# Patient Record
Sex: Male | Born: 1975 | Race: White | Hispanic: No | Marital: Married | Smoking: Never smoker
Health system: Southern US, Community
[De-identification: ages and names within clinical notes are randomized; demographics above are authoritative.]

## PROBLEM LIST (undated history)

## (undated) DIAGNOSIS — F419 Anxiety disorder, unspecified: Secondary | ICD-10-CM

## (undated) HISTORY — PX: OTHER SURGICAL HISTORY: SHX169

---

## 2012-08-16 ENCOUNTER — Emergency Department (HOSPITAL_BASED_OUTPATIENT_CLINIC_OR_DEPARTMENT_OTHER)
Admission: EM | Admit: 2012-08-16 | Discharge: 2012-08-16 | Disposition: A | Discharge status: Home / Self Care | Payer: Worker's Compensation | Attending: Emergency Medicine | Admitting: Emergency Medicine

## 2012-08-16 ENCOUNTER — Encounter (HOSPITAL_BASED_OUTPATIENT_CLINIC_OR_DEPARTMENT_OTHER): Payer: Self-pay | Admitting: Family Medicine

## 2012-08-16 DIAGNOSIS — S61209A Unspecified open wound of unspecified finger without damage to nail, initial encounter: Secondary | ICD-10-CM | POA: Insufficient documentation

## 2012-08-16 DIAGNOSIS — Y9389 Activity, other specified: Secondary | ICD-10-CM | POA: Insufficient documentation

## 2012-08-16 DIAGNOSIS — Y9289 Other specified places as the place of occurrence of the external cause: Secondary | ICD-10-CM | POA: Insufficient documentation

## 2012-08-16 DIAGNOSIS — W268XXA Contact with other sharp object(s), not elsewhere classified, initial encounter: Secondary | ICD-10-CM | POA: Insufficient documentation

## 2012-08-16 DIAGNOSIS — F411 Generalized anxiety disorder: Secondary | ICD-10-CM | POA: Insufficient documentation

## 2012-08-16 DIAGNOSIS — Y990 Civilian activity done for income or pay: Secondary | ICD-10-CM | POA: Insufficient documentation

## 2012-08-16 HISTORY — DX: Anxiety disorder, unspecified: F41.9

## 2012-08-16 NOTE — ED Provider Notes (Signed)
History     CSN: [id number]  Arrival date & time 08/16/12  0802   First MD Initiated Contact with Patient 08/16/12 0840      Chief Complaint  Patient presents with  . Extremity Laceration    (Consider location/radiation/quality/duration/timing/severity/associated sxs/prior treatment) The history is provided by the patient.   37 year old male suffered a laceration to his left fifth finger while he was using shears to try and strip some wire. Last tetanus immunization was within the past 3 years. He denies other injury.  Past Medical History  Diagnosis Date  . Anxiety     History reviewed. No pertinent past surgical history.  No family history on file.  History  Substance Use Topics  . Smoking status: Never Smoker   . Smokeless tobacco: Not on file  . Alcohol Use: Yes      Review of Systems  All other systems reviewed and are negative.    Allergies  Review of patient's allergies indicates no known allergies.  Home Medications   Current Outpatient Rx  Name  Route  Sig  Dispense  Refill  . ALPRAZOLAM PO   Oral   Take by mouth as needed.           BP 115/83  Pulse 71  Temp(Src) 97.6 F (36.4 C) (Oral)  Resp 16  Ht 5\' 5"  (1.651 m)  Wt 180 lb (81.647 kg)  BMI 29.95 kg/m2  SpO2 100%  Physical Exam  Nursing note and vitals reviewed.  37 year old male, resting comfortably and in no acute distress. Vital signs are normal. Oxygen saturation is 100%, which is normal. Head is normocephalic and atraumatic. PERRLA, EOMI. Oropharynx is clear. Neck is nontender and supple without adenopathy or JVD. Back is nontender and there is no CVA tenderness. Lungs are clear without rales, wheezes, or rhonchi. Chest is nontender. Heart has regular rate and rhythm without murmur. Abdomen is soft, flat, nontender without masses or hepatosplenomegaly and peristalsis is normoactive. Extremities: There is a 2 cm laceration of the ulnar aspect of the distal phalanx of the  left fifth finger. Laceration is linear and clean and does not have any contamination.. Skin is warm and dry without rash. Neurologic: Mental status is normal, cranial nerves are intact, there are no motor or sensory deficits.  ED Course  Procedures (including critical care time)  LACERATION REPAIR Performed by: WUJWJ,XBJYN Authorized by: WGNFA,OZHYQ Consent: Verbal consent obtained. Risks and benefits: risks, benefits and alternatives were discussed Consent given by: patient Patient identity confirmed: provided demographic data Prepped and Draped in normal sterile fashion Wound explored  Laceration Location: Left fifth finger distal phalanx  Laceration Length: 2.0 cm  No Foreign Bodies seen or palpated  Anesthesia: local infiltration  Local anesthetic: lidocaine 2% without epinephrine  Anesthetic total: 2 ml  Amount of cleaning: standard  Skin closure: Close   Number of sutures: 4  Technique: Simple over and under suture with 5-0 nylon   Patient tolerance: Patient tolerated the procedure well with no immediate complications.    1. Laceration of fifth finger, left       MDM  Laceration of the left fifth finger. Sutures are placed and is sent home with routine wound care instructions.        Dione Booze, MD 08/16/12 0900

## 2012-08-16 NOTE — ED Notes (Signed)
Pt has laceration, cut pinky finger on left hand with sheers at work this morning. Bleeding controlled with gauze.

## 2012-08-22 ENCOUNTER — Encounter (HOSPITAL_BASED_OUTPATIENT_CLINIC_OR_DEPARTMENT_OTHER): Payer: Self-pay | Admitting: *Deleted

## 2012-08-22 ENCOUNTER — Emergency Department (HOSPITAL_BASED_OUTPATIENT_CLINIC_OR_DEPARTMENT_OTHER)
Admission: EM | Admit: 2012-08-22 | Discharge: 2012-08-22 | Disposition: A | Discharge status: Home / Self Care | Payer: Worker's Compensation | Attending: Emergency Medicine | Admitting: Emergency Medicine

## 2012-08-22 DIAGNOSIS — Z4802 Encounter for removal of sutures: Secondary | ICD-10-CM | POA: Insufficient documentation

## 2012-08-22 DIAGNOSIS — Z79899 Other long term (current) drug therapy: Secondary | ICD-10-CM | POA: Insufficient documentation

## 2012-08-22 DIAGNOSIS — F411 Generalized anxiety disorder: Secondary | ICD-10-CM | POA: Insufficient documentation

## 2012-08-22 NOTE — ED Notes (Signed)
Pt here for suture removal

## 2012-08-22 NOTE — ED Provider Notes (Signed)
History     CSN: [id number]  Arrival date & time 08/22/12  1738   First MD Initiated Contact with Patient 08/22/12 1748      Chief Complaint  Patient presents with  . Suture / Staple Removal    (Consider location/radiation/quality/duration/timing/severity/associated sxs/prior treatment) Patient is a 37 y.o. male presenting with suture removal. The history is provided by the patient. No language interpreter was used.  Suture / Staple Removal  The sutures were placed 7 to 10 days ago. The fever has been present for less than 1 day. His temperature was unmeasured prior to arrival. There has been no drainage from the wound. There is no redness present. The pain has no pain.  Pt reports numbness distal to wound  Past Medical History  Diagnosis Date  . Anxiety     History reviewed. No pertinent past surgical history.  History reviewed. No pertinent family history.  History  Substance Use Topics  . Smoking status: Never Smoker   . Smokeless tobacco: Not on file  . Alcohol Use: Yes      Review of Systems  Skin: Positive for wound.  All other systems reviewed and are negative.    Allergies  Review of patient's allergies indicates no known allergies.  Home Medications   Current Outpatient Rx  Name  Route  Sig  Dispense  Refill  . ALPRAZOLAM PO   Oral   Take by mouth as needed.           BP 125/89  Pulse 83  Temp(Src) 97.9 F (36.6 C)  Resp 16  SpO2 100%  Physical Exam  Nursing note and vitals reviewed. Constitutional: He is oriented to person, place, and time. He appears well-developed and well-nourished.  Musculoskeletal: He exhibits tenderness.  Tender at incision,  Decreased sensation to touch distal finger.   Neurological: He is alert and oriented to person, place, and time. He has normal reflexes.  Skin: Skin is warm.  Psychiatric: He has a normal mood and affect.    ED Course  Procedures (including critical care time)  Labs Reviewed - No data  to display No results found.   No diagnosis found.    MDM  Pt counseled on numbness and healing        Lonia Skinner [place], [place] 08/22/12 [phone]

## 2012-08-23 NOTE — ED Provider Notes (Signed)
Medical screening examination/treatment/procedure(s) were performed by non-physician practitioner and as supervising physician I was immediately available for consultation/collaboration.   Carleene Cooper III, MD 08/23/12 [phone]

## 2014-08-20 ENCOUNTER — Emergency Department (HOSPITAL_COMMUNITY)
Admission: EM | Admit: 2014-08-20 | Discharge: 2014-08-20 | Disposition: A | Discharge status: Home / Self Care | Payer: BLUE CROSS/BLUE SHIELD | Attending: Emergency Medicine | Admitting: Emergency Medicine

## 2014-08-20 ENCOUNTER — Emergency Department (HOSPITAL_COMMUNITY): Payer: BLUE CROSS/BLUE SHIELD

## 2014-08-20 ENCOUNTER — Encounter (HOSPITAL_COMMUNITY): Payer: Self-pay

## 2014-08-20 DIAGNOSIS — Z79899 Other long term (current) drug therapy: Secondary | ICD-10-CM | POA: Insufficient documentation

## 2014-08-20 DIAGNOSIS — R197 Diarrhea, unspecified: Secondary | ICD-10-CM | POA: Insufficient documentation

## 2014-08-20 DIAGNOSIS — F419 Anxiety disorder, unspecified: Secondary | ICD-10-CM | POA: Diagnosis not present

## 2014-08-20 DIAGNOSIS — R1110 Vomiting, unspecified: Secondary | ICD-10-CM | POA: Diagnosis not present

## 2014-08-20 DIAGNOSIS — Z791 Long term (current) use of non-steroidal anti-inflammatories (NSAID): Secondary | ICD-10-CM | POA: Diagnosis not present

## 2014-08-20 DIAGNOSIS — R079 Chest pain, unspecified: Secondary | ICD-10-CM | POA: Diagnosis not present

## 2014-08-20 DIAGNOSIS — R0781 Pleurodynia: Secondary | ICD-10-CM | POA: Diagnosis not present

## 2014-08-20 LAB — COMPREHENSIVE METABOLIC PANEL
ALK PHOS: 72 U/L (ref 39–117)
ALT: 16 U/L (ref 0–53)
AST: 20 U/L (ref 0–37)
Albumin: 4.2 g/dL (ref 3.5–5.2)
Anion gap: 6 (ref 5–15)
BUN: 18 mg/dL (ref 6–23)
CALCIUM: 9.1 mg/dL (ref 8.4–10.5)
CO2: 27 mmol/L (ref 19–32)
Chloride: 104 mmol/L (ref 96–112)
Creatinine, Ser: 1.06 mg/dL (ref 0.50–1.35)
GFR calc Af Amer: >90 mL/min (ref >90)
GFR calc non Af Amer: 87 mL/min — ABNORMAL LOW (ref >90)
GLUCOSE: 107 mg/dL — AB (ref 70–99)
POTASSIUM: 3.7 mmol/L (ref 3.5–5.1)
Sodium: 137 mmol/L (ref 135–145)
TOTAL PROTEIN: 6.6 g/dL (ref 6.0–8.3)
Total Bilirubin: 1.3 mg/dL — ABNORMAL HIGH (ref 0.3–1.2)

## 2014-08-20 LAB — CBC WITH DIFFERENTIAL/PLATELET
BASOS PCT: 0 % (ref 0–1)
Basophils Absolute: 0 10*3/uL (ref 0.0–0.1)
EOS ABS: 0 10*3/uL (ref 0.0–0.7)
Eosinophils Relative: 0 % (ref 0–5)
HEMATOCRIT: 47.3 % (ref 39.0–52.0)
HEMOGLOBIN: 16.1 g/dL (ref 13.0–17.0)
LYMPHS ABS: 0.5 10*3/uL — AB (ref 0.7–4.0)
LYMPHS PCT: 5 % — AB (ref 12–46)
MCH: 29.8 pg (ref 26.0–34.0)
MCHC: 34 g/dL (ref 30.0–36.0)
MCV: 87.4 fL (ref 78.0–100.0)
Monocytes Absolute: 0.6 10*3/uL (ref 0.1–1.0)
Monocytes Relative: 7 % (ref 3–12)
Neutro Abs: 8.3 10*3/uL — ABNORMAL HIGH (ref 1.7–7.7)
Neutrophils Relative %: 88 % — ABNORMAL HIGH (ref 43–77)
Platelets: 193 10*3/uL (ref 150–400)
RBC: 5.41 MIL/uL (ref 4.22–5.81)
RDW: 13.3 % (ref 11.5–15.5)
WBC: 9.5 10*3/uL (ref 4.0–10.5)

## 2014-08-20 LAB — URINALYSIS, ROUTINE W REFLEX MICROSCOPIC
Bilirubin Urine: NEGATIVE
Glucose, UA: NEGATIVE mg/dL
Hgb urine dipstick: NEGATIVE
Ketones, ur: NEGATIVE mg/dL
Leukocytes, UA: NEGATIVE
NITRITE: NEGATIVE
PROTEIN: NEGATIVE mg/dL
SPECIFIC GRAVITY, URINE: 1.031 — AB (ref 1.005–1.030)
UROBILINOGEN UA: 1 mg/dL (ref 0.0–1.0)
pH: 6 (ref 5.0–8.0)

## 2014-08-20 LAB — LIPASE, BLOOD: Lipase: 24 U/L (ref 11–59)

## 2014-08-20 LAB — I-STAT TROPONIN, ED: Troponin i, poc: 0 ng/mL (ref 0.00–0.08)

## 2014-08-20 MED ORDER — ONDANSETRON HCL 4 MG/2ML IJ SOLN
4.0000 mg | Freq: Once | INTRAMUSCULAR | Status: AC
  Administered 2014-08-20: 4 mg via INTRAVENOUS
  Filled 2014-08-20: qty 2

## 2014-08-20 MED ORDER — ACETAMINOPHEN 325 MG PO TABS
650.0000 mg | ORAL_TABLET | Freq: Once | ORAL | Status: AC
  Administered 2014-08-20: 650 mg via ORAL
  Filled 2014-08-20: qty 2

## 2014-08-20 MED ORDER — HYDROCODONE-ACETAMINOPHEN 5-325 MG PO TABS: 2.0000 | ORAL_TABLET | ORAL | Status: AC | PRN

## 2014-08-20 MED ORDER — MORPHINE SULFATE 4 MG/ML IJ SOLN
4.0000 mg | Freq: Once | INTRAMUSCULAR | Status: AC
  Administered 2014-08-20: 4 mg via INTRAVENOUS
  Filled 2014-08-20: qty 1

## 2014-08-20 MED ORDER — SODIUM CHLORIDE 0.9 % IV BOLUS (SEPSIS)
1000.0000 mL | Freq: Once | INTRAVENOUS | Status: AC
  Administered 2014-08-20: 1000 mL via INTRAVENOUS

## 2014-08-20 MED ORDER — ONDANSETRON 4 MG PO TBDP: 4.0000 mg | ORAL_TABLET | Freq: Three times a day (TID) | ORAL | Status: AC | PRN

## 2014-08-20 NOTE — ED Notes (Signed)
Patient and family are not happy about being discharged. They want to speak to provider again. Waynetta SandyNotified Pickering, NP and Jeraldine LootsLockwood, MD.

## 2014-08-20 NOTE — ED Notes (Signed)
Patient returned from X-ray 

## 2014-08-20 NOTE — ED Notes (Signed)
Pt states he started having left sided rib pain a week ago and went to MD, thought more musculoskeletal and given pain med and muscle relaxers. Talked to MD on Monday not feeling any better and scheduled a CT for him. Last night started running fever, vomiting and having diarrhea this morning. Has thrown up 6 x and stooled 8 x. Also reports general body aches.

## 2014-08-20 NOTE — Discharge Instructions (Signed)
Stop the ultram while taking the hydrocodone Chest Pain (Nonspecific) It is often hard to give a specific diagnosis for the cause of chest pain. There is always a chance that your pain could be related to something serious, such as a heart attack or a blood clot in the lungs. You need to follow up with your health care provider for further evaluation. CAUSES   Heartburn.  Pneumonia or bronchitis.  Anxiety or stress.  Inflammation around your heart (pericarditis) or lung (pleuritis or pleurisy).  A blood clot in the lung.  A collapsed lung (pneumothorax). It can develop suddenly on its own (spontaneous pneumothorax) or from trauma to the chest.  Shingles infection (herpes zoster virus). The chest wall is composed of bones, muscles, and cartilage. Any of these can be the source of the pain.  The bones can be bruised by injury.  The muscles or cartilage can be strained by coughing or overwork.  The cartilage can be affected by inflammation and become sore (costochondritis). DIAGNOSIS  Lab tests or other studies may be needed to find the cause of your pain. Your health care provider may have you take a test called an ambulatory electrocardiogram (ECG). An ECG records your heartbeat patterns over a 24-hour period. You may also have other tests, such as:  Transthoracic echocardiogram (TTE). During echocardiography, sound waves are used to evaluate how blood flows through your heart.  Transesophageal echocardiogram (TEE).  Cardiac monitoring. This allows your health care provider to monitor your heart rate and rhythm in real time.  Holter monitor. This is a portable device that records your heartbeat and can help diagnose heart arrhythmias. It allows your health care provider to track your heart activity for several days, if needed.  Stress tests by exercise or by giving medicine that makes the heart beat faster. TREATMENT   Treatment depends on what may be causing your chest pain.  Treatment may include:  Acid blockers for heartburn.  Anti-inflammatory medicine.  Pain medicine for inflammatory conditions.  Antibiotics if an infection is present.  You may be advised to change lifestyle habits. This includes stopping smoking and avoiding alcohol, caffeine, and chocolate.  You may be advised to keep your head raised (elevated) when sleeping. This reduces the chance of acid going backward from your stomach into your esophagus. Most of the time, nonspecific chest pain will improve within 2-3 days with rest and mild pain medicine.  HOME CARE INSTRUCTIONS   If antibiotics were prescribed, take them as directed. Finish them even if you start to feel better.  For the next few days, avoid physical activities that bring on chest pain. Continue physical activities as directed.  Do not use any tobacco products, including cigarettes, chewing tobacco, or electronic cigarettes.  Avoid drinking alcohol.  Only take medicine as directed by your health care provider.  Follow your health care provider's suggestions for further testing if your chest pain does not go away.  Keep any follow-up appointments you made. If you do not go to an appointment, you could develop lasting (chronic) problems with pain. If there is any problem keeping an appointment, call to reschedule. SEEK MEDICAL CARE IF:   Your chest pain does not go away, even after treatment.  You have a rash with blisters on your chest.  You have a fever. SEEK IMMEDIATE MEDICAL CARE IF:   You have increased chest pain or pain that spreads to your arm, neck, jaw, back, or abdomen.  You have shortness of breath.  You  have an increasing cough, or you cough up blood.  You have severe back or abdominal pain.  You feel nauseous or vomit.  You have severe weakness.  You faint.  You have chills. This is an emergency. Do not wait to see if the pain will go away. Get medical help at once. Call your local emergency  services (911 in [place] Do not drive yourself to the hospital. MAKE SURE YOU:   Understand these instructions.  Will watch your condition.  Will get help right away if you are not doing well or get worse. Document Released: 03/24/2005 Document Revised: 06/19/2013 Document Reviewed: 01/18/2008 [facility] Patient Information 2015 [place], [place]. This information is not intended to replace advice given to you by your health care provider. Make sure you discuss any questions you have with your health care provider.  Viral Infections A viral infection can be caused by different types of viruses.Most viral infections are not serious and resolve on their own. However, some infections may cause severe symptoms and may lead to further complications. SYMPTOMS Viruses can frequently cause:  Minor sore throat.  Aches and pains.  Headaches.  Runny nose.  Different types of rashes.  Watery eyes.  Tiredness.  Cough.  Loss of appetite.  Gastrointestinal infections, resulting in nausea, vomiting, and diarrhea. These symptoms do not respond to antibiotics because the infection is not caused by bacteria. However, you might catch a bacterial infection following the viral infection. This is sometimes called a "superinfection." Symptoms of such a bacterial infection may include:  Worsening sore throat with pus and difficulty swallowing.  Swollen neck glands.  Chills and a high or persistent fever.  Severe headache.  Tenderness over the sinuses.  Persistent overall ill feeling (malaise), muscle aches, and tiredness (fatigue).  Persistent cough.  Yellow, green, or brown mucus production with coughing. HOME CARE INSTRUCTIONS   Only take over-the-counter or prescription medicines for pain, discomfort, diarrhea, or fever as directed by your caregiver.  Drink enough water and fluids to keep your urine clear or pale yellow. Sports drinks can provide valuable electrolytes, sugars, and  hydration.  Get plenty of rest and maintain proper nutrition. Soups and broths with crackers or rice are fine. SEEK IMMEDIATE MEDICAL CARE IF:   You have severe headaches, shortness of breath, chest pain, neck pain, or an unusual rash.  You have uncontrolled vomiting, diarrhea, or you are unable to keep down fluids.  You or your child has an oral temperature above 102 F (38.9 C), not controlled by medicine.  Your baby is older than 3 months with a rectal temperature of 102 F (38.9 C) or higher.  Your baby is 733 months old or younger with a rectal temperature of 100.4 F (38 C) or higher. MAKE SURE YOU:   Understand these instructions.  Will watch your condition.  Will get help right away if you are not doing well or get worse. Document Released: 03/24/2005 Document Revised: 09/06/2011 Document Reviewed: 10/19/2010 [facility]Exit[facility] Patient Information 2015 [place], [place]. This information is not intended to replace advice given to you by your health care provider. Make sure you discuss any questions you have with your health care provider.

## 2014-08-20 NOTE — ED Notes (Signed)
Requested urine again. Patient will try.

## 2014-08-20 NOTE — ED Notes (Signed)
Patient transported to x-ray. ?

## 2014-08-20 NOTE — ED Provider Notes (Signed)
CSN: [id number]     Arrival date & time 08/20/14  0800 History   First MD Initiated Contact with Patient 08/20/14 [phone]     Chief Complaint  Patient presents with  . Generalized Body Aches    left rib pain  . Emesis  . Diarrhea     (Consider location/radiation/quality/duration/timing/severity/associated sxs/prior Treatment) HPI Comments: Pt comes in with left lower rib pain times 1 week. Pt states that he saw his pcp and was put on muscle relaxer and pain medication without relief. Pt was seen again and was treated for possible shingles without relief. Last night he developed a fever and vomiting and diarrhea. Was scheduled for a ct scan this morning but was unsure that he could keep down the contrast so he came here. Denies sob, pain is worse with movement. No cough  The history is provided by the patient. No language interpreter was used.    Past Medical History  Diagnosis Date  . Anxiety    Past Surgical History  Procedure Laterality Date  . Other surgical history      chiari 1 malformation   No family history on file. History  Substance Use Topics  . Smoking status: Never Smoker   . Smokeless tobacco: Not on file  . Alcohol Use: Yes    Review of Systems  All other systems reviewed and are negative.     Allergies  Review of patient's allergies indicates no known allergies.  Home Medications   Prior to Admission medications   Medication Sig Start Date End Date Taking? Authorizing Provider  ALPRAZolam Prudy Feeler(XANAX) 0.5 MG tablet Take 0.5 mg by mouth 2 (two) times daily as needed. anxiety 05/21/14  Yes Historical Provider, MD  indomethacin (INDOCIN) 25 MG capsule Take 25 mg by mouth 3 (three) times daily. 08/13/14 08/13/15 Yes Historical Provider, MD  traMADol (ULTRAM) 50 MG tablet Take 50 mg by mouth every 8 (eight) hours as needed. pain 08/13/14  Yes Historical Provider, MD  valACYclovir (VALTREX) 1000 MG tablet Take 1,000 mg by mouth 3 (three) times daily. 08/15/14 08/15/15  Yes Historical Provider, MD   BP 134/84 mmHg  Pulse 91  Temp(Src) 98 F (36.7 C) (Oral)  Resp 18  SpO2 98% Physical Exam  Constitutional: He is oriented to person, place, and time. He appears well-developed and well-nourished.  HENT:  Head: Normocephalic and atraumatic.  Cardiovascular: Normal rate and regular rhythm.   Pulmonary/Chest: Effort normal and breath sounds normal.  Left lateral rib pain  Abdominal: Soft. Bowel sounds are normal. There is no tenderness.  Musculoskeletal: Normal range of motion.  Neurological: He is alert and oriented to person, place, and time.  Skin: Skin is warm and dry. No rash noted.  Psychiatric: He has a normal mood and affect.  Nursing note and vitals reviewed.   ED Course  Procedures (including critical care time) Labs Review Labs Reviewed  CBC WITH DIFFERENTIAL/PLATELET - Abnormal; Notable for the following:    Neutrophils Relative % 88 (*)    Neutro Abs 8.3 (*)    Lymphocytes Relative 5 (*)    Lymphs Abs 0.5 (*)    All other components within normal limits  COMPREHENSIVE METABOLIC PANEL - Abnormal; Notable for the following:    Glucose, Bld 107 (*)    Total Bilirubin 1.3 (*)    GFR calc non Af Amer 87 (*)    All other components within normal limits  URINALYSIS, ROUTINE W REFLEX MICROSCOPIC - Abnormal; Notable for the following:  Specific Gravity, Urine 1.031 (*)    All other components within normal limits  LIPASE, BLOOD  I-STAT TROPOININ, ED    Imaging Review Dg Chest 2 View  08/20/2014   CLINICAL DATA:  Fever, vomiting, diarrhea  EXAM: CHEST  2 VIEW  COMPARISON:  None.  FINDINGS: Cardiomediastinal silhouette is unremarkable. No acute infiltrate or pleural effusion. No pulmonary edema. Bony thorax is unremarkable.  IMPRESSION: No active cardiopulmonary disease.   Electronically Signed   By: Natasha Mead M.D.   On: 08/20/2014 10:10     Date: 08/20/2014  Rate: 102  Rhythm: sinus tachycardia  QRS Axis: normal  Intervals:  normal  ST/T Wave abnormalities: normal  Conduction Disutrbances:none  Narrative Interpretation:   Old EKG Reviewed: none available    MDM   Final diagnoses:  Chest pain  Vomiting and diarrhea   Doubt acs in nature. Will treat for hydrocodone for pain and zofran for vomiting. Pt has no pneumonia on x-ray. Pt is okay to follow up with pcp.    Teressa Lower, NP 08/20/14 1226  Gerhard Munch, MD 08/20/14 [phone]

## 2016-01-30 IMAGING — DX DG CHEST 2V
2 series · 2 of 2 positions shown · non-contrast
Comparison: None.

CLINICAL DATA: Fever, vomiting, diarrhea

EXAM:
CHEST  2 VIEW

[chest pa]
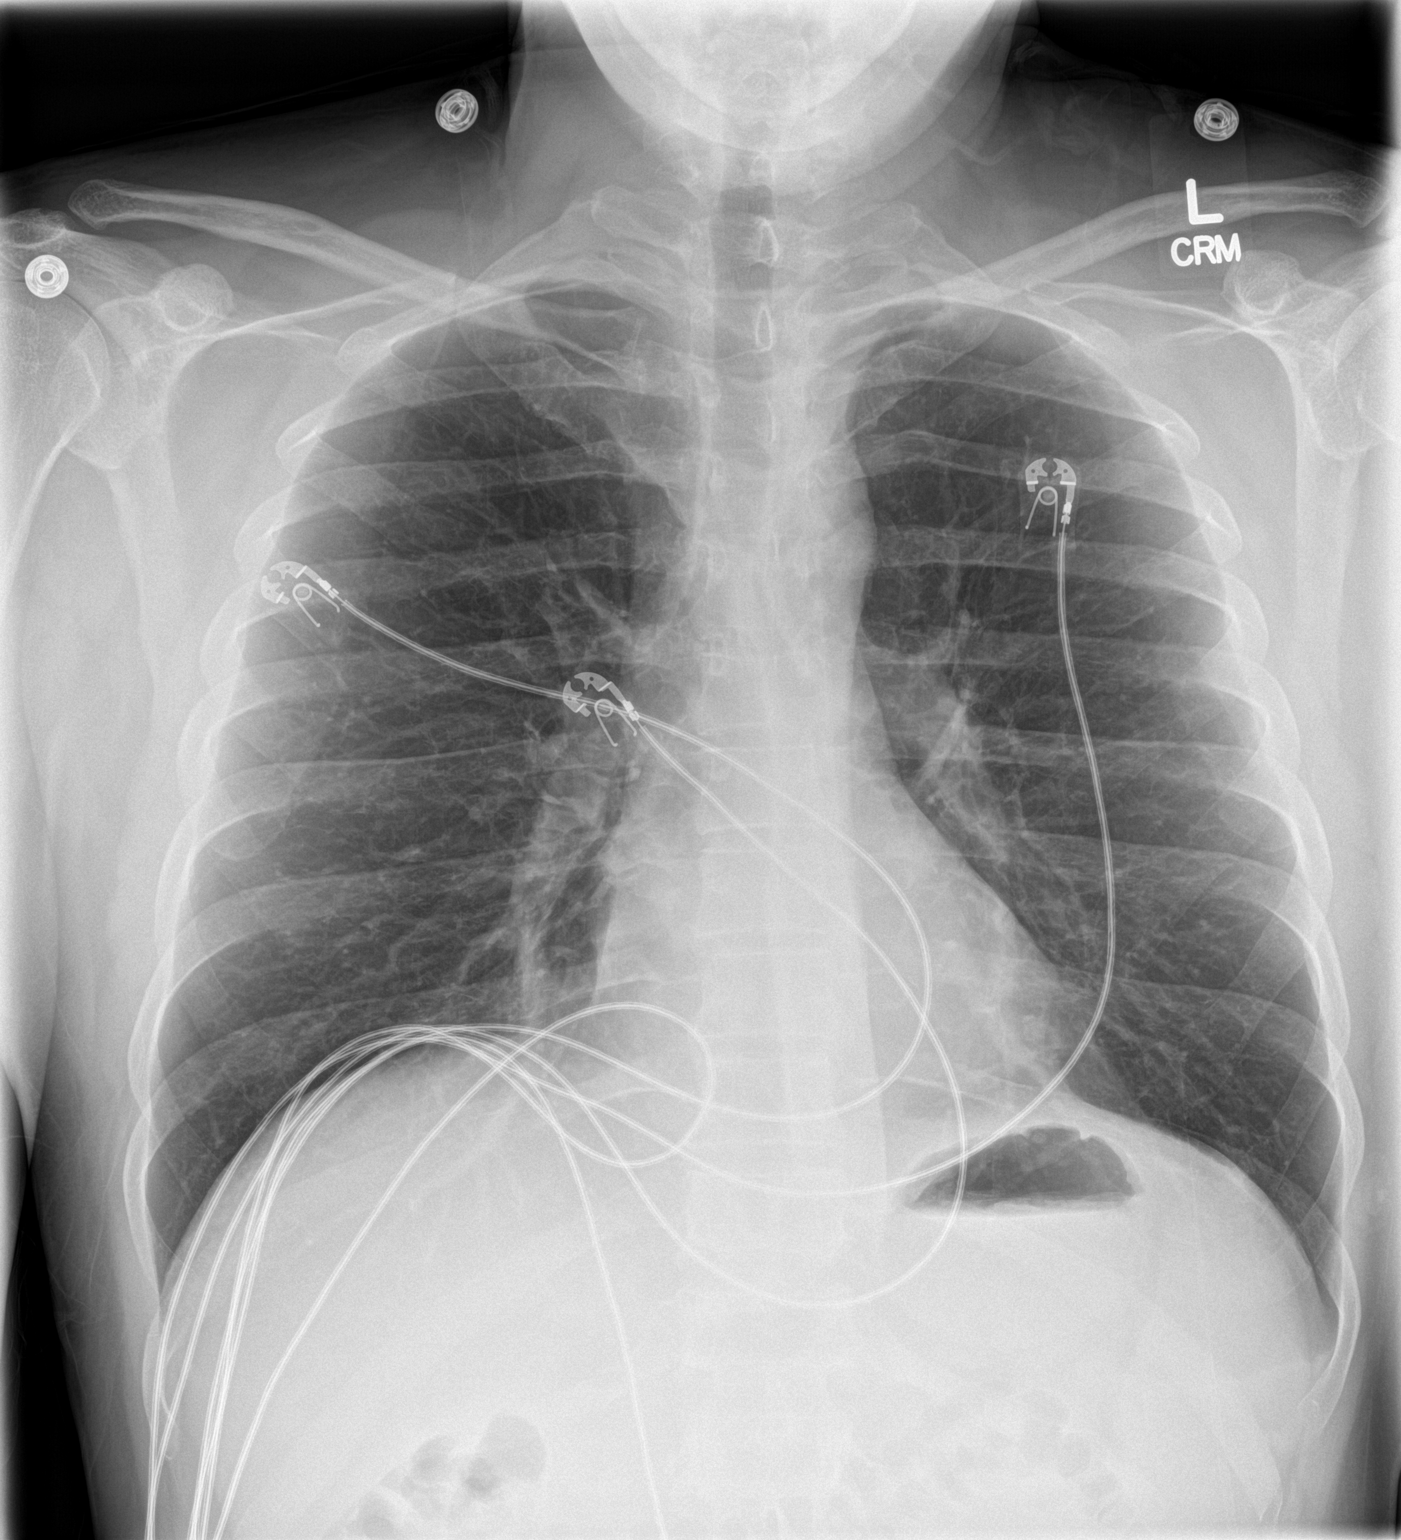

[chest lat]
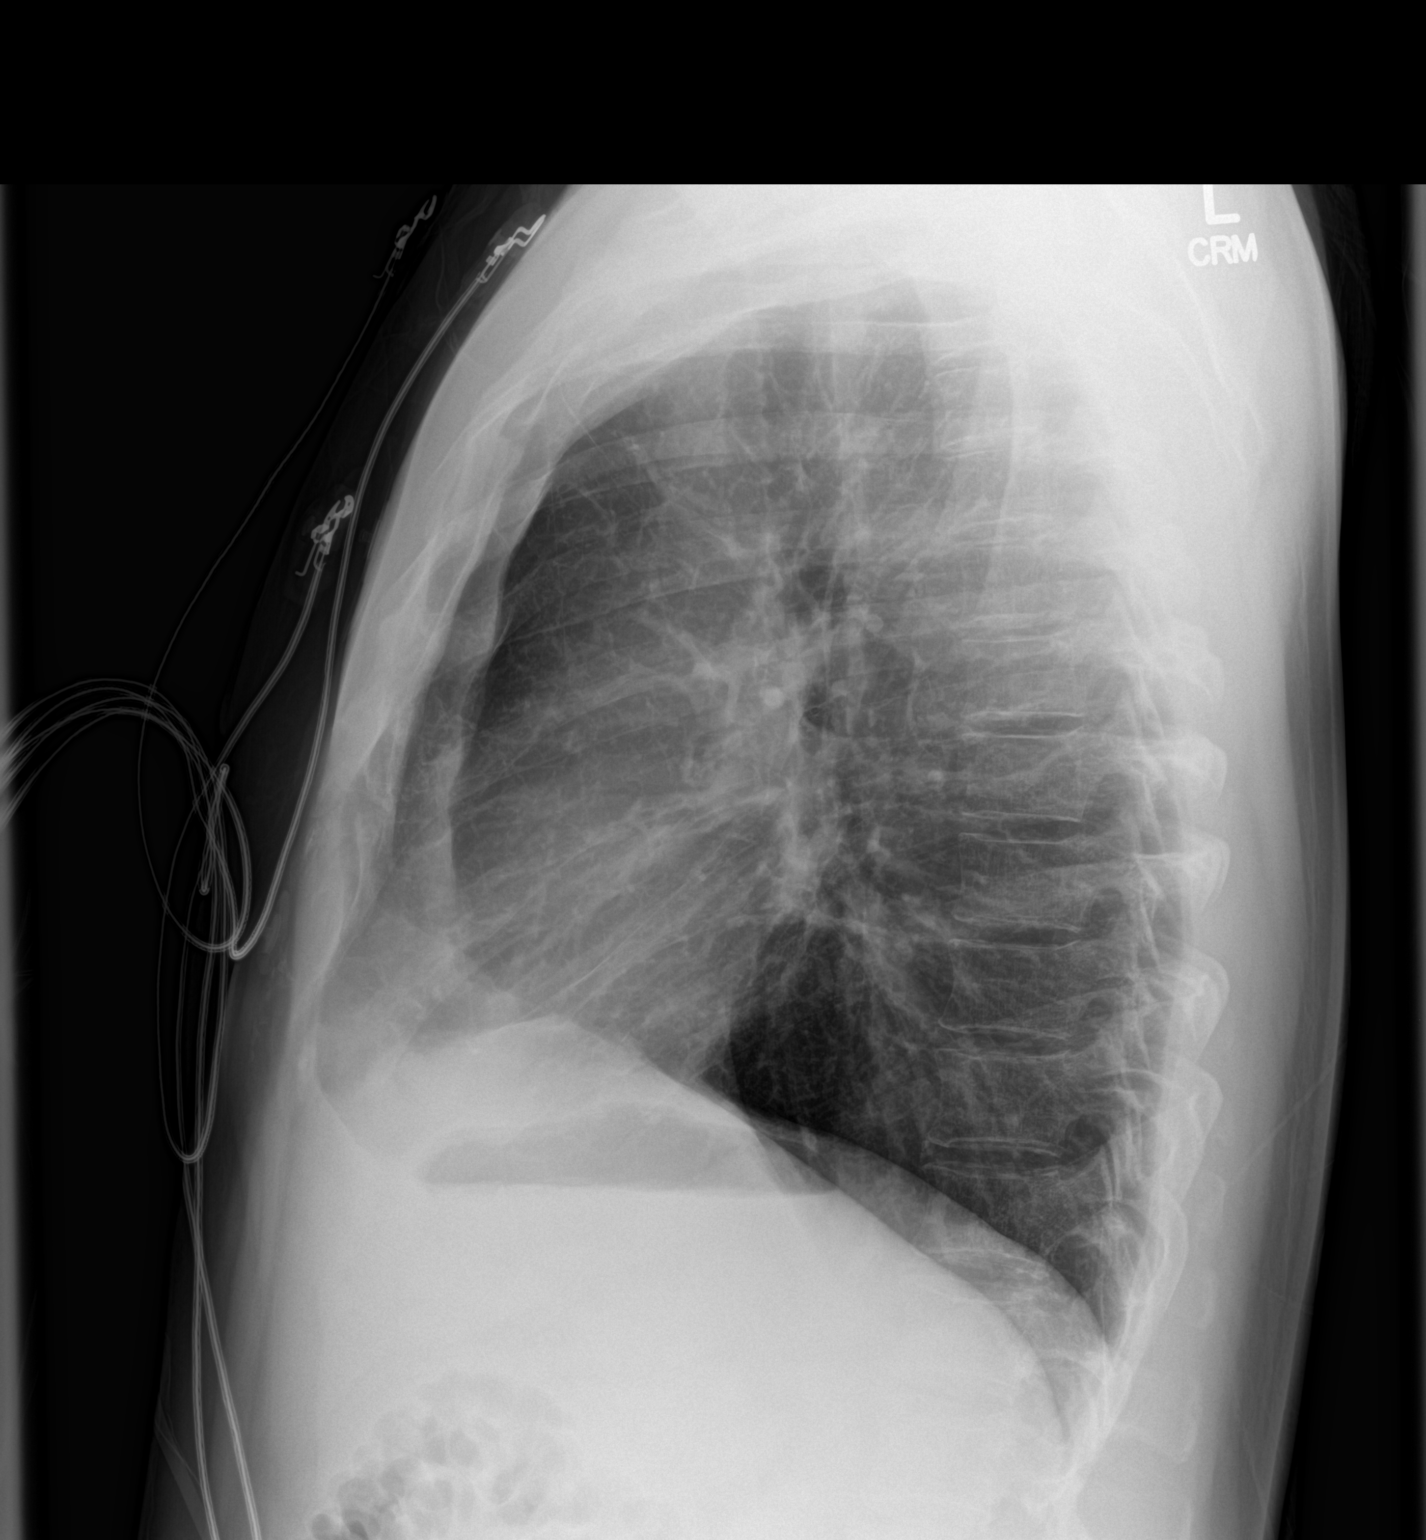

[2 of 2 positions shown; findings below may reference images not displayed]

FINDINGS: Cardiomediastinal silhouette is unremarkable. No acute infiltrate or
pleural effusion. No pulmonary edema. Bony thorax is unremarkable.
IMPRESSION: No active cardiopulmonary disease.

## 2016-08-23 ENCOUNTER — Emergency Department (HOSPITAL_BASED_OUTPATIENT_CLINIC_OR_DEPARTMENT_OTHER)
Admission: EM | Admit: 2016-08-23 | Discharge: 2016-08-23 | Disposition: A | Discharge status: Home / Self Care | Payer: BLUE CROSS/BLUE SHIELD | Attending: Emergency Medicine | Admitting: Emergency Medicine

## 2016-08-23 ENCOUNTER — Emergency Department (HOSPITAL_BASED_OUTPATIENT_CLINIC_OR_DEPARTMENT_OTHER): Payer: BLUE CROSS/BLUE SHIELD

## 2016-08-23 ENCOUNTER — Encounter (HOSPITAL_BASED_OUTPATIENT_CLINIC_OR_DEPARTMENT_OTHER): Payer: Self-pay | Admitting: *Deleted

## 2016-08-23 DIAGNOSIS — Y939 Activity, unspecified: Secondary | ICD-10-CM | POA: Diagnosis not present

## 2016-08-23 DIAGNOSIS — S93401A Sprain of unspecified ligament of right ankle, initial encounter: Secondary | ICD-10-CM | POA: Diagnosis not present

## 2016-08-23 DIAGNOSIS — S99911A Unspecified injury of right ankle, initial encounter: Secondary | ICD-10-CM | POA: Diagnosis present

## 2016-08-23 DIAGNOSIS — Y999 Unspecified external cause status: Secondary | ICD-10-CM | POA: Insufficient documentation

## 2016-08-23 DIAGNOSIS — X501XXA Overexertion from prolonged static or awkward postures, initial encounter: Secondary | ICD-10-CM | POA: Diagnosis not present

## 2016-08-23 DIAGNOSIS — Y929 Unspecified place or not applicable: Secondary | ICD-10-CM | POA: Diagnosis not present

## 2016-08-23 MED ORDER — IBUPROFEN 400 MG PO TABS: 400.0000 mg | ORAL_TABLET | Freq: Four times a day (QID) | ORAL | 0 refills | Status: AC

## 2016-08-23 MED ORDER — IBUPROFEN 400 MG PO TABS
400.0000 mg | ORAL_TABLET | Freq: Once | ORAL | Status: AC
  Administered 2016-08-23: 400 mg via ORAL
  Filled 2016-08-23: qty 1

## 2016-08-23 NOTE — ED Triage Notes (Signed)
Twisted his right ankle this afternoon while getting out of his truck. Swelling. Motrin offered but refused.

## 2016-08-23 NOTE — ED Provider Notes (Signed)
[facility] Provider Note   CSN: [id number] Arrival date & time: 08/23/16  1439  By signing my name below, I, Paul Finley, attest that this documentation has been prepared under the direction and in the presence of physician practitioner, Marily Memos, MD. Electronically Signed: Linna Finley, Scribe. 08/23/2016. 3:12 PM.  History   Chief Complaint Chief Complaint  Patient presents with  . Ankle Injury    The history is provided by the patient. No language interpreter was used.    HPI Comments: Paul Finley is a 41 y.o. male who presents to the Emergency Department complaining of sudden onset, constant, lateral right ankle pain s/p "rolling it" while exiting a vehicle a couple of hours ago. He is unsure whether he inverted or everted his right ankle. No falls, head trauma, or LOC. He reports associated swelling. He endorses pain exacerbation with ambulation and with applied pressure to his lateral right ankle. No medications or treatments tried PTA. No h/o right ankle problems. Pt denies numbness/tingling, neuro deficits, or any other associated symptoms.  Past Medical History:  Diagnosis Date  . Anxiety     There are no active problems to display for this patient.   Past Surgical History:  Procedure Laterality Date  . OTHER SURGICAL HISTORY     chiari 1 malformation       Home Medications    Prior to Admission medications   Medication Sig Start Date End Date Taking? Authorizing Provider  ALPRAZolam Prudy Feeler) 0.5 MG tablet Take 0.5 mg by mouth 2 (two) times daily as needed. anxiety 05/21/14  Yes Historical Provider, MD  HYDROcodone-acetaminophen (NORCO/VICODIN) 5-325 MG per tablet Take 2 tablets by mouth every 4 (four) hours as needed. 08/20/14   Teressa Lower, NP  ibuprofen (ADVIL,MOTRIN) 400 MG tablet Take 1 tablet (400 mg total) by mouth 4 (four) times daily. 08/23/16 08/30/16  Marily Memos, MD  ondansetron (ZOFRAN ODT) 4 MG disintegrating tablet Take  1 tablet (4 mg total) by mouth every 8 (eight) hours as needed for nausea or vomiting. 08/20/14   Teressa Lower, NP  traMADol (ULTRAM) 50 MG tablet Take 50 mg by mouth every 8 (eight) hours as needed. pain 08/13/14   Historical Provider, MD    Family History No family history on file.  Social History Social History  Substance Use Topics  . Smoking status: Never Smoker  . Smokeless tobacco: Never Used  . Alcohol use Yes     Allergies   Patient has no known allergies.   Review of Systems Review of Systems  Musculoskeletal: Positive for arthralgias and joint swelling.  Neurological: Negative for syncope and numbness.  All other systems reviewed and are negative.    Physical Exam Updated Vital Signs BP 117/82   Pulse 79   Temp 98.1 F (36.7 C) (Oral)   Resp 20   SpO2 99%   Physical Exam  Constitutional: He is oriented to person, place, and time. He appears well-developed and well-nourished. No distress.  HENT:  Head: Normocephalic and atraumatic.  Eyes: Conjunctivae and EOM are normal.  Neck: Neck supple. No tracheal deviation present.  Cardiovascular: Normal rate.   Pulmonary/Chest: Effort normal. No respiratory distress.  Musculoskeletal: Normal range of motion. He exhibits tenderness. He exhibits no deformity.  Swelling around right lateral malleolus. No deformity noted. Tenderness posterior to the right lateral malleolus. Strong DP pulse. No right ankle instability. No proximal fibular tenderness.  Neurological: He is alert and oriented to person, place, and time.  Skin: Skin  is warm and dry.  Psychiatric: He has a normal mood and affect. His behavior is normal.  Nursing note and vitals reviewed.    ED Treatments / Results  Labs (all labs ordered are listed, but only abnormal results are displayed) Labs Reviewed - No data to display  EKG  EKG Interpretation None       Radiology Dg Ankle Complete Right  Result Date: 08/23/2016 CLINICAL DATA:   41 year old male status post twisting injury today with swelling. Initial encounter. EXAM: RIGHT ANKLE - COMPLETE 3+ VIEW COMPARISON:  None. FINDINGS: Anterior and lateral soft tissue swelling at the right ankle. Preserved mortise joint alignment. Talar dome intact. No definite joint effusion. No distal tibia or fibula fracture identified. Calcaneus intact. Visible right foot osseous structures appear intact. IMPRESSION: Soft tissue swelling with no acute fracture or dislocation identified about the right ankle. Electronically Signed   By: Odessa FlemingH  Hall M.D.   On: 08/23/2016 15:05    Procedures Procedures (including critical care time)  DIAGNOSTIC STUDIES: Oxygen Saturation is 99% on RA, normal by my interpretation.    COORDINATION OF CARE: 3:20 PM Discussed treatment plan with pt at bedside and pt agreed to plan.  Medications Ordered in ED Medications  ibuprofen (ADVIL,MOTRIN) tablet 400 mg (400 mg Oral Given 08/23/16 1538)     Initial Impression / Assessment and Plan / ED Course  I have reviewed the triage vital signs and the nursing notes.  Pertinent labs & imaging results that were available during my care of the patient were reviewed by me and considered in my medical decision making (see chart for details).     41 yo  m who presents to ED with R ankle pain and swelling after fall with eversion. On exam has pain with ROM, slight swelling, pain with palpation of malleolus. Difficulty bearing weight for more than a couple steps. So XR done to evaluate for fracture and negative. No proximal fibular ttp to suggest fracture or unstable injury. No laxity in ankle to suggest severe grade 3 sprain.  Plan for air splint, ace wrap. NSAIDs for pain. Will also employ RICE therapy and weight bearing as tolerated. If symptoms not improving in one week, will plan to follow up with PCP or orthopedics for repeat imaging to evaluate for occult fracture.    Final Clinical Impressions(s) / ED Diagnoses    Final diagnoses:  Sprain of right ankle, unspecified ligament, initial encounter    New Prescriptions Discharge Medication List as of 08/23/2016  3:22 PM    START taking these medications   Details  ibuprofen (ADVIL,MOTRIN) 400 MG tablet Take 1 tablet (400 mg total) by mouth 4 (four) times daily., Starting Mon 08/23/2016, Until Mon 08/30/2016, Print       I personally performed the services described in this documentation, which was scribed in my presence. The recorded information has been reviewed and is accurate.   Marily MemosJason Pink Maye, MD 08/24/16 [phone]

## 2019-08-23 ENCOUNTER — Other Ambulatory Visit: Payer: Self-pay

## 2019-08-23 ENCOUNTER — Ambulatory Visit: Payer: BLUE CROSS/BLUE SHIELD | Attending: Family

## 2019-08-23 DIAGNOSIS — Z23 Encounter for immunization: Secondary | ICD-10-CM | POA: Insufficient documentation

## 2019-08-23 NOTE — Progress Notes (Signed)
   Covid-19 Vaccination Clinic  Name:  Paul Finley    MRN: [id number] DOB: [birth year]  08/23/2019  Paul Finley was observed post Covid-19 immunization for 15 minutes without incidence. He was provided with Vaccine Information Sheet and instruction to access the V-Safe system.   Paul Finley was instructed to call 911 with any severe reactions post vaccine: Marland Kitchen Difficulty breathing  . Swelling of your face and throat  . A fast heartbeat  . A bad rash all over your body  . Dizziness and weakness    Immunizations Administered    Name Date Dose VIS Date Route   Moderna COVID-19 Vaccine 08/23/2019  3:20 PM 0.5 mL 05/29/2019 Intramuscular   Manufacturer: Moderna   Lot: [id number]   NDC: 12929-090-30

## 2019-09-25 ENCOUNTER — Ambulatory Visit: Payer: BLUE CROSS/BLUE SHIELD | Attending: Family

## 2019-09-25 DIAGNOSIS — Z23 Encounter for immunization: Secondary | ICD-10-CM

## 2019-09-25 NOTE — Progress Notes (Signed)
   Covid-19 Vaccination Clinic  Name:  Paul Finley    MRN: [id number] DOB: Apr 05, 1976  09/25/2019  Paul Finley was observed post Covid-19 immunization for 15 minutes without incident. He was provided with Vaccine Information Sheet and instruction to access the V-Safe system.   Paul Finley was instructed to call 911 with any severe reactions post vaccine: Marland Kitchen Difficulty breathing  . Swelling of face and throat  . A fast heartbeat  . A bad rash all over body  . Dizziness and weakness   Immunizations Administered    Name Date Dose VIS Date Route   Moderna COVID-19 Vaccine 09/25/2019  2:29 PM 0.5 mL 05/29/2019 Intramuscular   Manufacturer: Moderna   Lot: [id number]   NDC: 00447-158-06
